# Patient Record
Sex: Male | Born: 1990 | Race: Black or African American | Hispanic: No | Marital: Single | Smoking: Never smoker
Health system: Southern US, Community
[De-identification: ages and names within clinical notes are randomized; demographics above are authoritative.]

## PROBLEM LIST (undated history)

## (undated) DIAGNOSIS — J45909 Unspecified asthma, uncomplicated: Secondary | ICD-10-CM

## (undated) HISTORY — PX: MYRINGOTOMY: SUR874

## (undated) HISTORY — DX: Unspecified asthma, uncomplicated: J45.909

---
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
## 2011-04-30 DIAGNOSIS — L2089 Other atopic dermatitis: Secondary | ICD-10-CM | POA: Insufficient documentation

## 2011-04-30 DIAGNOSIS — L709 Acne, unspecified: Secondary | ICD-10-CM | POA: Insufficient documentation

## 2012-08-11 DIAGNOSIS — L819 Disorder of pigmentation, unspecified: Secondary | ICD-10-CM | POA: Insufficient documentation

## 2012-12-15 DIAGNOSIS — IMO0002 Reserved for concepts with insufficient information to code with codable children: Secondary | ICD-10-CM | POA: Insufficient documentation

## 2013-04-13 ENCOUNTER — Ambulatory Visit: Payer: Self-pay | Admitting: Family Medicine

## 2013-07-20 DIAGNOSIS — L299 Pruritus, unspecified: Secondary | ICD-10-CM | POA: Insufficient documentation

## 2014-04-14 HISTORY — PX: REFRACTIVE SURGERY: SHX103

## 2015-05-16 ENCOUNTER — Telehealth: Payer: Self-pay | Admitting: Family Medicine

## 2015-05-16 NOTE — Telephone Encounter (Signed)
Spoke with patient, i do not see an actual note or evidence when he had the break out of chicken pox and in [facility] it just says immunity from having this disease, will request chart from storage.-aa

## 2015-05-16 NOTE — Telephone Encounter (Signed)
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  He needs to know when he had chicken pox. And also needs a tb test.  He said he was a patient of Dr. Wonda Olds when he had the chicken pox.  It was around 1996 or 97.  He is going to find out from the employer if he needs the blood test or for TB.   His call back is  [phone]  Thanks Barth Kirks

## 2015-05-16 NOTE — Telephone Encounter (Signed)
Pt advised that office note was found from 2001 and copy of that left up front for patient to pick up. Patient was advised if he needs anything else for this from [place] to let us know.-aa

## 2015-05-21 ENCOUNTER — Encounter: Payer: Self-pay | Admitting: Family Medicine

## 2015-05-21 ENCOUNTER — Ambulatory Visit (INDEPENDENT_AMBULATORY_CARE_PROVIDER_SITE_OTHER): Payer: BLUE CROSS/BLUE SHIELD | Admitting: Family Medicine

## 2015-05-21 VITALS — BP 116/76 | HR 82 | Temp 97.8°F | Resp 16 | Ht 70.0 in | Wt 246.2 lb

## 2015-05-21 DIAGNOSIS — Z0184 Encounter for antibody response examination: Secondary | ICD-10-CM

## 2015-05-21 NOTE — Patient Instructions (Signed)
We will call you with the lab result. 

## 2015-05-21 NOTE — Progress Notes (Signed)
Subjective:     Patient ID: Rodney Ali, male   DOB: April 07, 1991, 25 y.o.   MRN: [id number]  HPI  Chief Complaint  Patient presents with  . Labs Only    Patient comes in office today requesting a lab slip for varicella titer. Patient reports that he had virus as a child, but needs titer done for his job.      Review of Systems  Respiratory:       Reports mild EIA if he runs in cold air.       Objective:   Physical Exam  Constitutional: He appears well-developed and well-nourished. No distress.       Assessment:    1. Immunity status testing - Varicella zoster antibody, IgG    Plan:    Will provide copy of test results for patient's job.

## 2015-05-22 ENCOUNTER — Telehealth: Payer: Self-pay

## 2015-05-22 LAB — VARICELLA ZOSTER ANTIBODY, IGG: VARICELLA: 1596 {index} (ref >165)

## 2015-05-22 NOTE — Telephone Encounter (Signed)
-----   Message from Anola Gurney, [place] sent at 05/22/2015  8:10 AM EST ----- Antibody titer c/w prior chicken pox. Please provide copy for the patient.

## 2015-05-22 NOTE — Telephone Encounter (Signed)
Patient has been advised, copy of report is at front desk for pick up. KW

## 2015-05-26 IMAGING — CR DG CHEST 2V
1 series · 3 of 3 positions shown · non-contrast
Comparison: None.

CLINICAL DATA: Pain.  Abnormal EKG.

EXAM:
CHEST  2 VIEW

[Series 1: pa · 0.17mm/px · 3 of 3 slices shown]
[im 1/3]
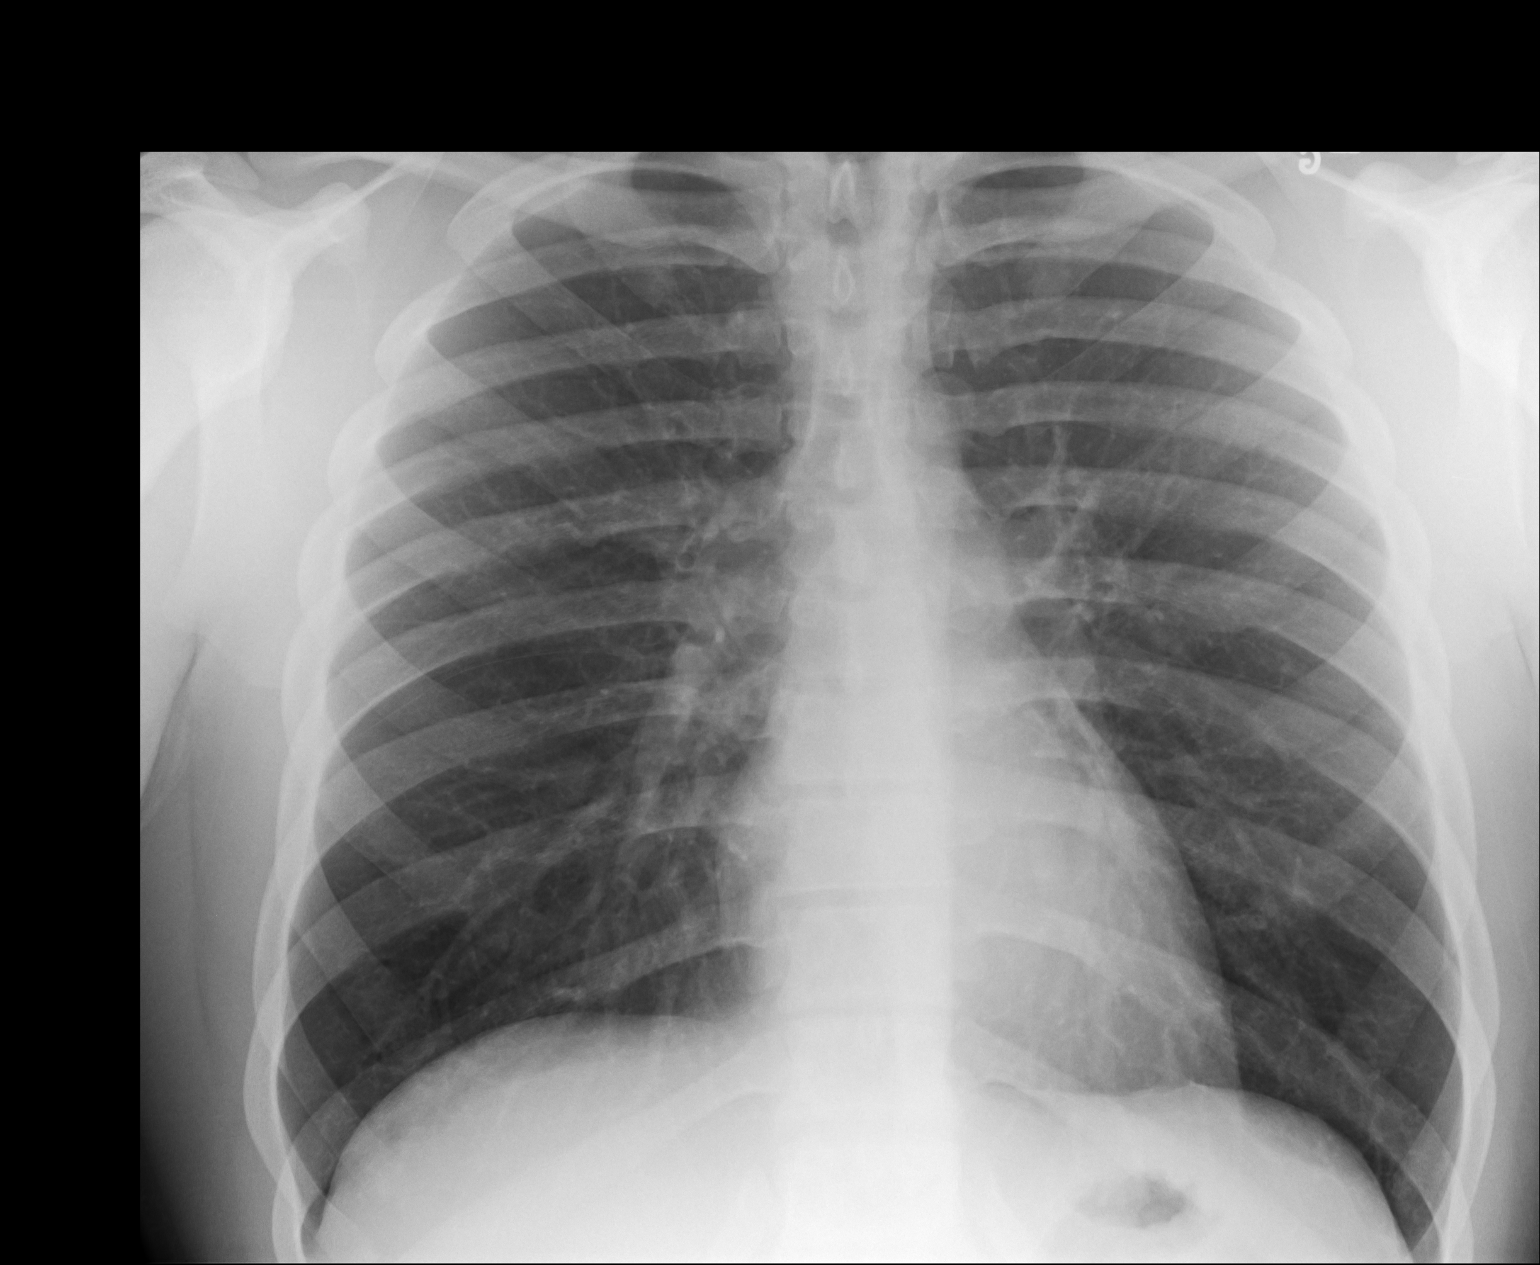
[im 2/3]
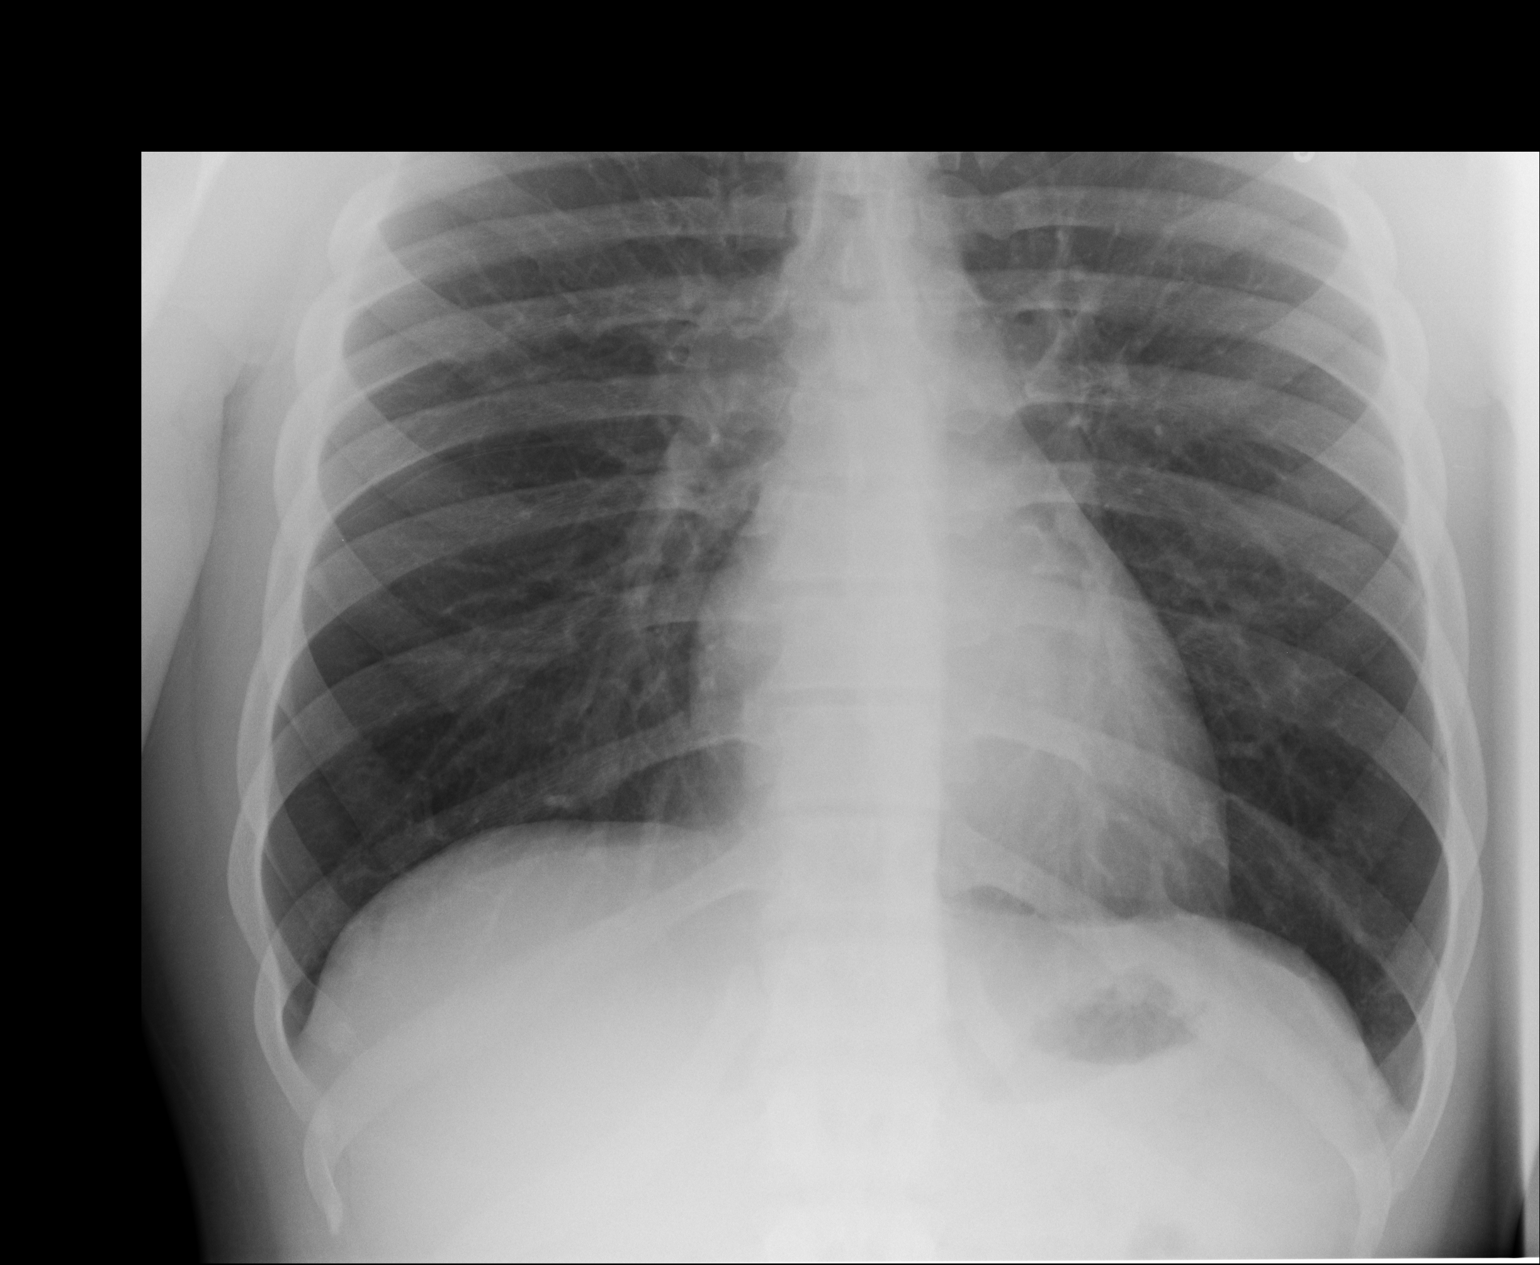
[im 3/3]
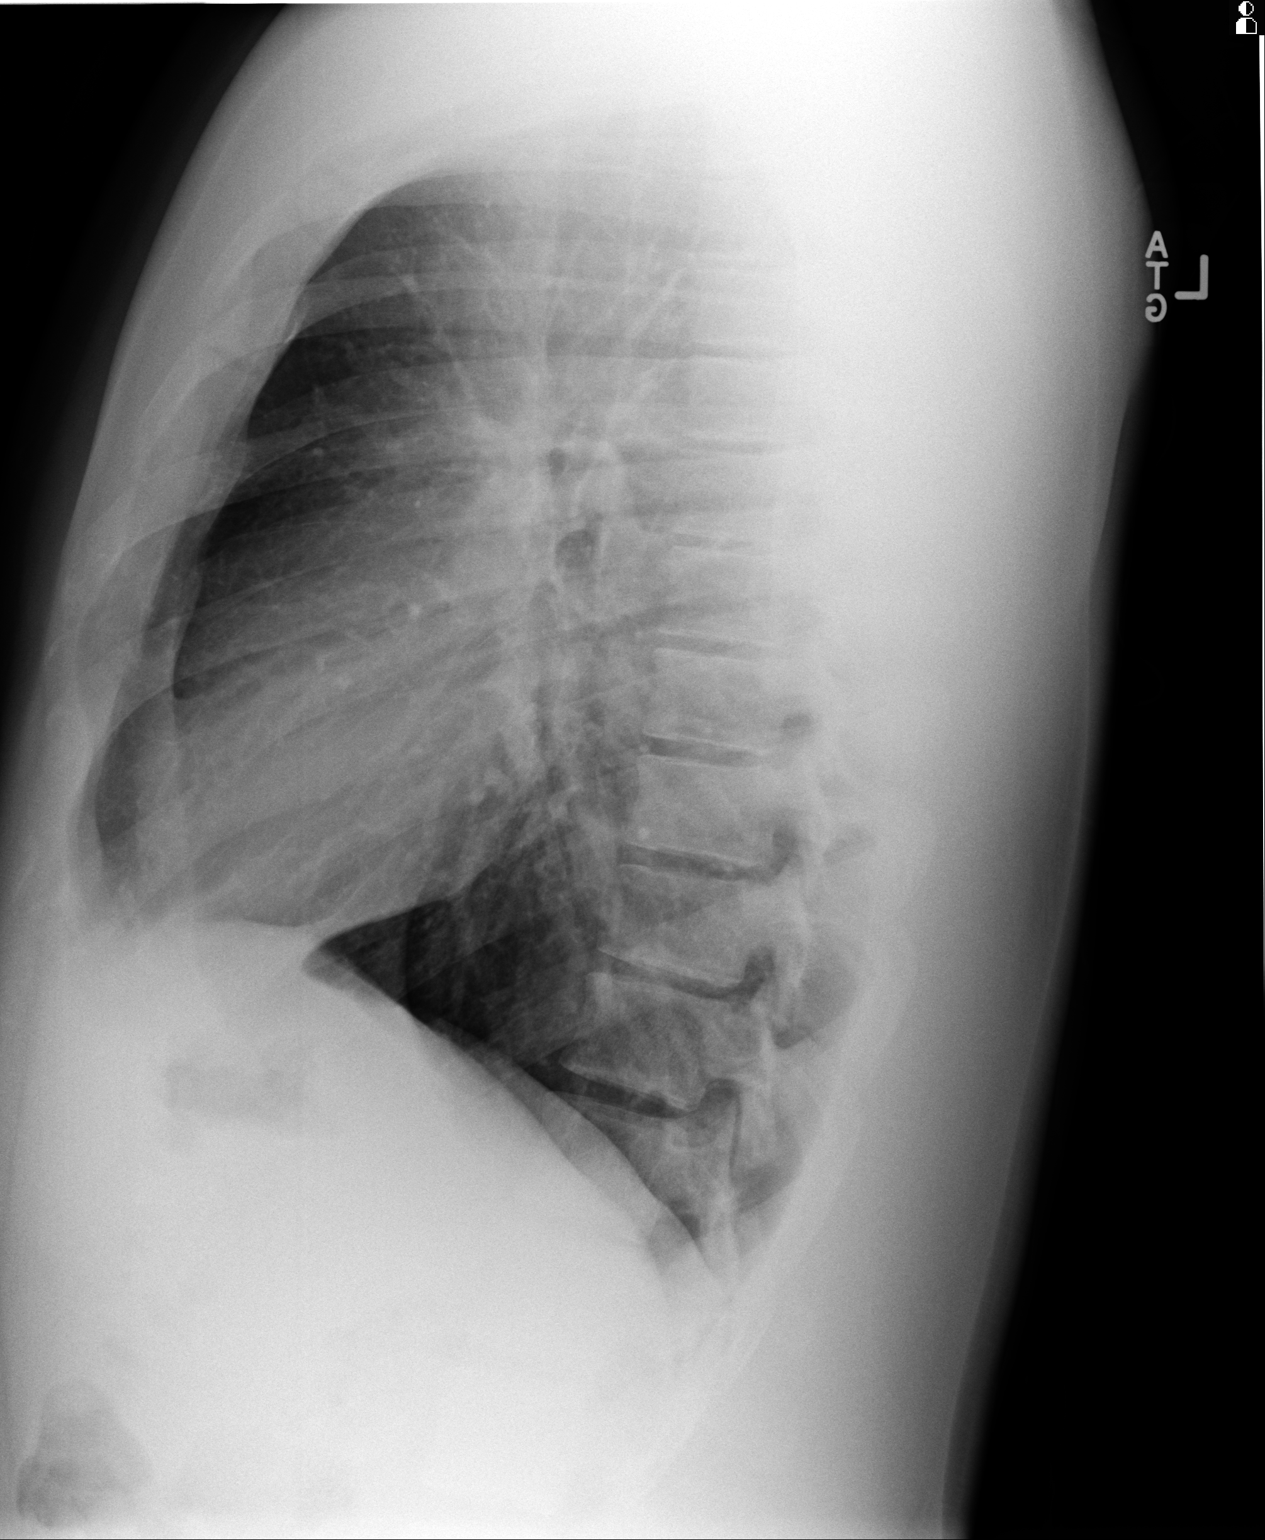

[3 of 3 positions shown; findings below may reference images not displayed]

FINDINGS: The heart size and mediastinal contours are within normal limits.
Both lungs are clear. The visualized skeletal structures are
unremarkable.
IMPRESSION: No active cardiopulmonary disease.

## 2016-05-28 ENCOUNTER — Ambulatory Visit (INDEPENDENT_AMBULATORY_CARE_PROVIDER_SITE_OTHER): Payer: BLUE CROSS/BLUE SHIELD | Admitting: Family Medicine

## 2016-05-28 ENCOUNTER — Encounter: Payer: Self-pay | Admitting: Family Medicine

## 2016-05-28 VITALS — BP 132/84 | HR 68 | Temp 97.8°F | Resp 16 | Ht 69.0 in | Wt 252.0 lb

## 2016-05-28 DIAGNOSIS — Z0000 Encounter for general adult medical examination without abnormal findings: Secondary | ICD-10-CM | POA: Diagnosis not present

## 2016-05-28 DIAGNOSIS — Z23 Encounter for immunization: Secondary | ICD-10-CM

## 2016-05-28 DIAGNOSIS — K6289 Other specified diseases of anus and rectum: Secondary | ICD-10-CM

## 2016-05-28 LAB — POCT URINALYSIS DIPSTICK
Bilirubin, UA: NEGATIVE
Glucose, UA: NEGATIVE
KETONES UA: NEGATIVE
LEUKOCYTES UA: NEGATIVE
Nitrite, UA: NEGATIVE
PH UA: 7.5
PROTEIN UA: NEGATIVE
RBC UA: NEGATIVE
SPEC GRAV UA: 1.015
Urobilinogen, UA: 0.2

## 2016-05-28 NOTE — Progress Notes (Signed)
Patient: Rodney Ali, Male    DOB: [birth year], 26 y.o.   MRN: [id number] Visit Date: 05/28/2016  Today's Provider: Megan Mansichard Charisse Wendell Jr, MD   Chief Complaint  Patient presents with  . Annual Exam  . Rectal Pain   Subjective:    Annual physical exam Rodney Ali is a 26 y.o. male who presents today for health maintenance and complete physical. He feels well. He reports exercising for about 80 minutes 4 days a week. Is boxing and play golf. He reports he is sleeping well. Pt is c/o rectal pain. Pt has noticed the pain while squatting or working on core exercises. Pt denies bloody stool, abdominal pain, constipation/dirrahea. No known anal trauma. He is single and presently lives in [place]. He is hoping to get a new job in [place]. -----------------------------------------------------------------   Review of Systems  Constitutional: Negative.   HENT: Negative.   Eyes: Negative.   Respiratory: Negative.   Cardiovascular: Negative.   Gastrointestinal: Positive for rectal pain. Negative for abdominal distention, abdominal pain, anal bleeding, blood in stool, constipation, diarrhea, nausea and vomiting.  Endocrine: Negative.   Genitourinary: Negative.   Musculoskeletal: Negative.   Skin: Negative.   Allergic/Immunologic: Negative.   Neurological: Negative.   Hematological: Negative.   Psychiatric/Behavioral: Negative.     Social History      He  reports that he has never smoked. He quit smokeless tobacco use about 2 years ago. He reports that he drinks alcohol. He reports that he does not use drugs.       Social History   Social History  . Marital status: Single    Spouse name: N/A  . Number of children: 0  . Years of education: bachelors   Social History Main Topics  . Smoking status: Never Smoker  . Smokeless tobacco: Former NeurosurgeonUser    Quit date: 04/13/2014  . Alcohol use 0.0 oz/week     Comment: couple times a week  . Drug use: No  . Sexual  activity: Yes   Other Topics Concern  . None   Social History Narrative  . None    Past Medical History:  Diagnosis Date  . Asthma, mild      Patient Active Problem List   Diagnosis Date Noted  . Acne 04/30/2011    Past Surgical History:  Procedure Laterality Date  . MYRINGOTOMY    . REFRACTIVE SURGERY Bilateral 2016    Family History        Family Status  Relation Status  . Mother Alive  . Father Alive  . Brother Alive        His family history includes Asthma in his brother; Healthy in his mother; Hypertension in his father.     Allergies  Allergen Reactions  . Fruit & Vegetable Daily [Nutritional Supplements]     Patient is only allergic to some fruits and raw vegetables.      Current Outpatient Prescriptions:  .  diphenhydrAMINE (BENADRYL) 25 mg capsule, Take by mouth as needed., Disp: , Rfl:  .  tretinoin (RETIN-A) 0.025 % cream, , Disp: , Rfl: 0 .  lisdexamfetamine (VYVANSE) 50 MG capsule, as needed., Disp: , Rfl:    Patient Care Team: Maple Hudsonichard L Yoshimi Sarr Jr., MD as PCP - General (Family Medicine)      Objective:   Vitals: BP 132/84 (BP Location: Left Arm, Patient Position: Sitting, Cuff Size: Large)   Pulse 68   Temp 97.8 F (36.6 C) (Oral)  Resp 16   Ht 5\' 9"  (1.753 m)   Wt 252 lb (114.3 kg)   BMI 37.21 kg/m    Physical Exam  Constitutional: He is oriented to person, place, and time. He appears well-developed and well-nourished. No distress.  HENT:  Head: Normocephalic and atraumatic.  Right Ear: Tympanic membrane and external ear normal.  Left Ear: Tympanic membrane and external ear normal.  Nose: Nose normal.  Mouth/Throat: Oropharynx is clear and moist. No oropharyngeal exudate.  Eyes: Conjunctivae are normal. Pupils are equal, round, and reactive to light. Right eye exhibits no discharge. Left eye exhibits no discharge.  Neck: Normal range of motion. Neck supple. No tracheal deviation present. No thyromegaly present.    Cardiovascular: Normal rate, regular rhythm and normal heart sounds.   Pulmonary/Chest: Effort normal and breath sounds normal. No respiratory distress.  Abdominal: Soft. Bowel sounds are normal. There is no tenderness.  Genitourinary: Rectum normal, prostate normal and penis normal. No penile tenderness.  Genitourinary Comments: Left perianal tenderness, no fullness but there is little tenderness. No obvious hemorrhoid. No DRE is performed because of the tenderness and young age.  Musculoskeletal: Normal range of motion. He exhibits no edema.  Lymphadenopathy:    He has no cervical adenopathy.  Neurological: He is alert and oriented to person, place, and time. He has normal reflexes.  Skin: Skin is warm and dry. He is not diaphoretic.  Psychiatric: He has a normal mood and affect. His behavior is normal. Judgment and thought content normal.     Depression Screen PHQ 2/9 Scores 05/28/2016  PHQ - 2 Score 0      Assessment & Plan:     Routine Health Maintenance and Physical Exam  Exercise Activities and Dietary recommendations Goals    None       There is no immunization history on file for this patient.  Health Maintenance  Topic Date Due  . HIV Screening  03/13/2007  . TETANUS/TDAP  03/13/2011  . INFLUENZA VACCINE  11/13/2015     Discussed health benefits of physical activity, and encouraged him to engage in regular exercise appropriate for his age and condition.    -------------------------------------------------------------------- 1. Annual physical exam Stable. Will check labs. FU pending results. Pt possible moving to [place]. Discussed keeping weight down as he ages. - CBC with Differential/Platelet - Comprehensive metabolic panel - Lipid panel - TSH - POCT urinalysis dipstick  2. Perianal pain Hot baths for now. If this persists will refer to surgery as this could be a very early perianal abscess.  3. Need for hepatitis A vaccination Administered  today. - Hepatitis A vaccine adult IM  4. Need for HPV vaccination First vaccine administered today. Pt given updated [facility] report [facility] so pt can complete series. - HPV 9-valent vaccine,Recombinat   Patient seen and examined by Julieanne Manson, MD, and note scribed by Allene Dillon, CMA.  Kyleigha Markert Wendelyn Breslow, MD  [facility]

## 2016-05-30 LAB — CBC WITH DIFFERENTIAL/PLATELET
Basophils Absolute: 0.1 10*3/uL (ref 0.0–0.2)
Basos: 2 %
EOS (ABSOLUTE): 0.3 10*3/uL (ref 0.0–0.4)
EOS: 5 %
HEMATOCRIT: 47.1 % (ref 37.5–51.0)
HEMOGLOBIN: 16 g/dL (ref 13.0–17.7)
Immature Grans (Abs): 0 10*3/uL (ref 0.0–0.1)
Immature Granulocytes: 0 %
LYMPHS ABS: 1.8 10*3/uL (ref 0.7–3.1)
Lymphs: 34 %
MCH: 31.1 pg (ref 26.6–33.0)
MCHC: 34 g/dL (ref 31.5–35.7)
MCV: 92 fL (ref 79–97)
MONOCYTES: 14 %
Monocytes Absolute: 0.7 10*3/uL (ref 0.1–0.9)
NEUTROS ABS: 2.3 10*3/uL (ref 1.4–7.0)
Neutrophils: 45 %
Platelets: 257 10*3/uL (ref 150–379)
RBC: 5.15 x10E6/uL (ref 4.14–5.80)
RDW: 13.2 % (ref 12.3–15.4)
WBC: 5.1 10*3/uL (ref 3.4–10.8)

## 2016-05-30 LAB — COMPREHENSIVE METABOLIC PANEL
ALBUMIN: 4.6 g/dL (ref 3.5–5.5)
ALK PHOS: 59 IU/L (ref 39–117)
ALT: 28 IU/L (ref 0–44)
AST: 25 IU/L (ref 0–40)
Albumin/Globulin Ratio: 1.7 (ref 1.2–2.2)
BUN / CREAT RATIO: 13 (ref 9–20)
BUN: 18 mg/dL (ref 6–20)
Bilirubin Total: 0.5 mg/dL (ref 0.0–1.2)
CO2: 24 mmol/L (ref 18–29)
CREATININE: 1.41 mg/dL — AB (ref 0.76–1.27)
Calcium: 10.2 mg/dL (ref 8.7–10.2)
Chloride: 99 mmol/L (ref 96–106)
GFR, EST AFRICAN AMERICAN: 80 mL/min/{1.73_m2} (ref >59)
GFR, EST NON AFRICAN AMERICAN: 69 mL/min/{1.73_m2} (ref >59)
GLOBULIN, TOTAL: 2.7 g/dL (ref 1.5–4.5)
Glucose: 98 mg/dL (ref 65–99)
Potassium: 4.8 mmol/L (ref 3.5–5.2)
SODIUM: 141 mmol/L (ref 134–144)
Total Protein: 7.3 g/dL (ref 6.0–8.5)

## 2016-05-30 LAB — TSH: TSH: 1.49 u[IU]/mL (ref 0.450–4.500)

## 2016-05-30 LAB — LIPID PANEL
CHOL/HDL RATIO: 4.1 ratio (ref 0.0–5.0)
CHOLESTEROL TOTAL: 148 mg/dL (ref 100–199)
HDL: 36 mg/dL — ABNORMAL LOW (ref >39)
LDL CALC: 85 mg/dL (ref 0–99)
Triglycerides: 137 mg/dL (ref 0–149)
VLDL Cholesterol Cal: 27 mg/dL (ref 5–40)

## 2016-06-02 ENCOUNTER — Telehealth: Payer: Self-pay | Admitting: Family Medicine

## 2016-06-02 DIAGNOSIS — K6289 Other specified diseases of anus and rectum: Secondary | ICD-10-CM

## 2016-06-02 NOTE — Telephone Encounter (Signed)
Pt's father Rodney Ali stated that they would like to go ahead with the referral that was discussed during pt's OV on 05/28/16. Rodney Ali was advised that pt is out of the office today. Please advise. Thanks TNP

## 2016-06-02 NOTE — Telephone Encounter (Signed)
Please review, I think its about the surgery referral but wanted to make sure before putting the order in-aa

## 2016-06-02 NOTE — Telephone Encounter (Signed)
Surgery referral for perianal pain. It could be a very early abscess. He was certainly more sensitive on the left than the right anal verge.

## 2016-06-02 NOTE — Telephone Encounter (Signed)
Orders placed.

## 2016-06-03 ENCOUNTER — Encounter: Payer: Self-pay | Admitting: *Deleted

## 2016-06-17 ENCOUNTER — Ambulatory Visit (INDEPENDENT_AMBULATORY_CARE_PROVIDER_SITE_OTHER): Payer: BLUE CROSS/BLUE SHIELD | Admitting: General Surgery

## 2016-06-17 ENCOUNTER — Encounter: Payer: Self-pay | Admitting: General Surgery

## 2016-06-17 VITALS — BP 126/88 | HR 108 | Resp 12 | Ht 69.0 in | Wt 246.0 lb

## 2016-06-17 DIAGNOSIS — R102 Pelvic and perineal pain: Secondary | ICD-10-CM | POA: Diagnosis not present

## 2016-06-17 NOTE — Progress Notes (Signed)
Patient ID: Rodney Ali, male   DOB: [birth year], 26 y.o.   MRN: [id number]  Chief Complaint  Patient presents with  . Other    HPI Rodney DelChristopher P Sermons is a 26 y.o. male here today for a evaluation of a perianal pain. He noticed some tenderness about a month ago.The discomfort is described as a spasm-like sensation, on the left side more often than the right. This usually lasts only about 20  Seconds. It started at the same time he took up boxing for exercise.   He may have several episodes in one day, and then may go for a week without symptoms. No pain when he moves his bowels, voiding or during intercourse.    He majored in exercise physiology at [employer], now working in Field seismologistmedical sales.    HPI  Past Medical History:  Diagnosis Date  . Asthma, mild     Past Surgical History:  Procedure Laterality Date  . MYRINGOTOMY    . REFRACTIVE SURGERY Bilateral 2016    Family History  Problem Relation Age of Onset  . Healthy Mother   . Hypertension Father   . Asthma Brother     Social History Social History  Substance Use Topics  . Smoking status: Never Smoker  . Smokeless tobacco: Former NeurosurgeonUser    Quit date: 04/13/2014  . Alcohol use 0.0 oz/week     Comment: couple times a week    Allergies  Allergen Reactions  . Fruit & Vegetable Daily [Nutritional Supplements]     Patient is only allergic to some fruits and raw vegetables.     Current Outpatient Prescriptions  Medication Sig Dispense Refill  . diphenhydrAMINE (BENADRYL) 25 mg capsule Take by mouth as needed.    Marland Kitchen. lisdexamfetamine (VYVANSE) 50 MG capsule as needed.    . tretinoin (RETIN-A) 0.025 % cream   0   No current facility-administered medications for this visit.     Review of Systems Review of Systems  Constitutional: Negative.   Respiratory: Negative.   Cardiovascular: Negative.     Blood pressure 126/88, pulse (!) 108, resp. rate 12, height 5\' 9"  (1.753 m), weight 246 lb (111.6  kg).  Physical Exam Physical Exam  Constitutional: He is oriented to person, place, and time. He appears well-developed and well-nourished.  Cardiovascular: Normal rate, regular rhythm and normal heart sounds.   Pulmonary/Chest: Effort normal and breath sounds normal.  Genitourinary: Prostate normal. Rectal exam shows external hemorrhoid. Rectal exam shows no fissure, no mass and anal tone normal.     Lymphadenopathy:       Right: No inguinal adenopathy present.       Left: No inguinal adenopathy present.  Neurological: He is alert and oriented to person, place, and time.  Skin: Skin is warm and dry.      Assessment    Perineal spasm, unclear etiology.     Plan    Observation at present. Patient to report if increased frequency or if he detects any triggering events.      This information has been scribed by Ples SpecterJessica Qualls CMA.   Earline MayotteByrnett, [place] Latka W 06/18/2016, 9:58 AM

## 2016-06-18 DIAGNOSIS — R102 Pelvic and perineal pain: Secondary | ICD-10-CM | POA: Insufficient documentation

## 2018-01-18 ENCOUNTER — Encounter: Payer: BLUE CROSS/BLUE SHIELD | Admitting: Family Medicine

## 2018-01-25 ENCOUNTER — Other Ambulatory Visit: Payer: Self-pay

## 2018-01-25 ENCOUNTER — Ambulatory Visit (INDEPENDENT_AMBULATORY_CARE_PROVIDER_SITE_OTHER): Payer: BLUE CROSS/BLUE SHIELD | Admitting: Family Medicine

## 2018-01-25 VITALS — BP 142/88 | HR 83 | Temp 97.7°F | Resp 16 | Ht 70.0 in | Wt 244.0 lb

## 2018-01-25 DIAGNOSIS — Z0000 Encounter for general adult medical examination without abnormal findings: Secondary | ICD-10-CM | POA: Diagnosis not present

## 2018-01-25 NOTE — Progress Notes (Signed)
Patient: Rodney Ali, Male    DOB: 06/18/1990, 27 y.o.   MRN: [id number] Visit Date: 01/25/2018  Today's Provider: Wilhemena Durie, MD   Chief Complaint  Patient presents with  . Annual Exam   Subjective:  Rodney Ali is a 27 y.o. male who presents today for health maintenance and complete physical. He feels well. He reports exercising 5 times weekly. He reports he is sleeping well. He is single and lives in [place]. His 67 yo Mother has recently been diagnosed with Stage 4 pancreatic cancer. Overall he feels well and is coping ok.  Review of Systems  Constitutional: Negative.   HENT: Negative.   Eyes: Negative.   Respiratory: Negative.   Cardiovascular: Negative.   Gastrointestinal: Negative.   Endocrine: Negative.   Genitourinary: Negative.   Musculoskeletal: Negative.   Skin: Negative.   Allergic/Immunologic: Negative.   Neurological: Negative.   Hematological: Negative.   Psychiatric/Behavioral: Negative.     Social History   Socioeconomic History  . Marital status: Single    Spouse name: Not on file  . Number of children: 0  . Years of education: bachelors  . Highest education level: Not on file  Occupational History  . Not on file  Social Needs  . Financial resource strain: Not on file  . Food insecurity:    Worry: Not on file    Inability: Not on file  . Transportation needs:    Medical: Not on file    Non-medical: Not on file  Tobacco Use  . Smoking status: Never Smoker  . Smokeless tobacco: Former Network engineer and Sexual Activity  . Alcohol use: Yes    Alcohol/week: 0.0 standard drinks    Comment: couple times a week  . Drug use: No  . Sexual activity: Yes  Lifestyle  . Physical activity:    Days per week: Not on file    Minutes per session: Not on file  . Stress: Not on file  Relationships  . Social connections:    Talks on phone: Not on file    Gets together: Not on file    Attends religious service: Not on file   Active member of club or organization: Not on file    Attends meetings of clubs or organizations: Not on file    Relationship status: Not on file  . Intimate partner violence:    Fear of current or ex partner: Not on file    Emotionally abused: Not on file    Physically abused: Not on file    Forced sexual activity: Not on file  Other Topics Concern  . Not on file  Social History Narrative  . Not on file    Patient Active Problem List   Diagnosis Date Noted  . Perineal pain in male 06/18/2016    Past Surgical History:  Procedure Laterality Date  . MYRINGOTOMY    . REFRACTIVE SURGERY Bilateral 2016    His family history includes Asthma in his brother; Healthy in his mother; Hypertension in his father.     Outpatient Encounter Medications as of 01/25/2018  Medication Sig Note  . diphenhydrAMINE (BENADRYL) 25 mg capsule Take by mouth as needed. 05/21/2015: Received from: [facility] Received Sig: Take by mouth.  . [DISCONTINUED] lisdexamfetamine (VYVANSE) 50 MG capsule as needed. 05/21/2015: Received from: [facility] Received Sig: TAKE ONE CAPSULE BY MOUTH DAILY  . [DISCONTINUED] tretinoin (RETIN-A) 0.025 % cream  05/21/2015: Received from: External Pharmacy  No facility-administered encounter medications on file as of 01/25/2018.     Patient Care Team: Jerrol Banana., MD as PCP - General (Family Medicine) Jerrol Banana., MD (Family Medicine) Bary Castilla Forest Gleason, MD (General Surgery)      Objective:   Vitals:  Vitals:   01/25/18 0818  BP: (!) 142/88  Pulse: 83  Resp: 16  Temp: 97.7 F (36.5 C)  TempSrc: Oral  SpO2: 98%  Weight: 244 lb (110.7 kg)  Height: 5' 10"  (1.778 m)    Physical Exam  Constitutional: He is oriented to person, place, and time. He appears well-developed and well-nourished.  Muscular male in NAD.Waist size 35-36.  HENT:  Head: Normocephalic and atraumatic.  Right Ear: External ear  normal.  Left Ear: External ear normal.  Nose: Nose normal.  Mouth/Throat: Oropharynx is clear and moist.  Eyes: Pupils are equal, round, and reactive to light. Conjunctivae and EOM are normal.  Neck: Normal range of motion. Neck supple.  Cardiovascular: Normal rate, regular rhythm and normal heart sounds.  Pulmonary/Chest: Effort normal and breath sounds normal.  Abdominal: Soft. Bowel sounds are normal.  Genitourinary: Penis normal.  Musculoskeletal: Normal range of motion.  Neurological: He is alert and oriented to person, place, and time.  Skin: Skin is warm and dry.  Psychiatric: He has a normal mood and affect. His behavior is normal. Judgment and thought content normal.  Recheck BP is 120/74.   Depression Screen PHQ 2/9 Scores 01/25/2018 05/28/2016  PHQ - 2 Score 0 0      Assessment & Plan:     Routine Health Maintenance and Physical Exam  Exercise Activities and Dietary recommendations Goals   None     Immunization History  Administered Date(s) Administered  . DTaP 05/10/1992, 08/02/1992, 10/02/1992, 10/04/1993, 05/25/1997  . HPV 9-valent 05/28/2016  . Hepatitis A 04/09/2010  . Hepatitis A, Adult 05/28/2016  . Hepatitis B 03/16/1992, 04/20/1992, 10/02/1992  . HiB (PRP-OMP) 05/10/1992, 08/02/1992, 11/14/1992, 10/04/1993  . IPV 05/10/1992, 08/02/1992, 10/02/1992, 05/25/1997  . Influenza Split 12/14/2015  . Influenza, Seasonal, Injecte, Preservative Fre 02/03/2017  . MMR 10/04/1993, 05/25/1997  . Meningococcal Conjugate 04/09/2010  . Tdap 04/09/2010    Health Maintenance  Topic Date Due  . HIV Screening  03/13/2007  . INFLUENZA VACCINE  11/12/2017  . TETANUS/TDAP  04/09/2020     Discussed health benefits of physical activity, and encouraged him to engage in regular exercise appropriate for his age and condition. Discussed goal waist of 34 inches for him.   I have done the exam and reviewed the chart and it is accurate to the best of my knowledge.  Development worker, community has been used and  any errors in dictation or transcription are unintentional. Miguel Aschoff M.D. [facility]

## 2018-01-26 LAB — LIPID PANEL WITH LDL/HDL RATIO
Cholesterol, Total: 133 mg/dL (ref 100–199)
HDL: 37 mg/dL — AB (ref >39)
LDL Calculated: 36 mg/dL (ref 0–99)
LDL/HDL RATIO: 1 ratio (ref 0.0–3.6)
TRIGLYCERIDES: 298 mg/dL — AB (ref 0–149)
VLDL CHOLESTEROL CAL: 60 mg/dL — AB (ref 5–40)

## 2018-01-26 LAB — COMPREHENSIVE METABOLIC PANEL
ALT: 18 IU/L (ref 0–44)
AST: 17 IU/L (ref 0–40)
Albumin/Globulin Ratio: 2 (ref 1.2–2.2)
Albumin: 4.7 g/dL (ref 3.5–5.5)
Alkaline Phosphatase: 57 IU/L (ref 39–117)
BUN/Creatinine Ratio: 12 (ref 9–20)
BUN: 20 mg/dL (ref 6–20)
Bilirubin Total: 0.5 mg/dL (ref 0.0–1.2)
CALCIUM: 10.2 mg/dL (ref 8.7–10.2)
CO2: 23 mmol/L (ref 20–29)
CREATININE: 1.66 mg/dL — AB (ref 0.76–1.27)
Chloride: 102 mmol/L (ref 96–106)
GFR calc Af Amer: 65 mL/min/{1.73_m2} (ref >59)
GFR, EST NON AFRICAN AMERICAN: 56 mL/min/{1.73_m2} — AB (ref >59)
GLUCOSE: 86 mg/dL (ref 65–99)
Globulin, Total: 2.3 g/dL (ref 1.5–4.5)
Potassium: 4.7 mmol/L (ref 3.5–5.2)
Sodium: 143 mmol/L (ref 134–144)
Total Protein: 7 g/dL (ref 6.0–8.5)

## 2018-01-26 LAB — CBC WITH DIFFERENTIAL/PLATELET
BASOS ABS: 0.1 10*3/uL (ref 0.0–0.2)
Basos: 2 %
EOS (ABSOLUTE): 0.3 10*3/uL (ref 0.0–0.4)
Eos: 7 %
Hematocrit: 49.5 % (ref 37.5–51.0)
Hemoglobin: 16.5 g/dL (ref 13.0–17.7)
IMMATURE GRANS (ABS): 0 10*3/uL (ref 0.0–0.1)
Immature Granulocytes: 0 %
LYMPHS: 41 %
Lymphocytes Absolute: 1.7 10*3/uL (ref 0.7–3.1)
MCH: 29.8 pg (ref 26.6–33.0)
MCHC: 33.3 g/dL (ref 31.5–35.7)
MCV: 90 fL (ref 79–97)
MONOS ABS: 0.6 10*3/uL (ref 0.1–0.9)
Monocytes: 14 %
NEUTROS PCT: 36 %
Neutrophils Absolute: 1.5 10*3/uL (ref 1.4–7.0)
PLATELETS: 272 10*3/uL (ref 150–450)
RBC: 5.53 x10E6/uL (ref 4.14–5.80)
RDW: 12.4 % (ref 12.3–15.4)
WBC: 4.2 10*3/uL (ref 3.4–10.8)

## 2018-01-26 LAB — TSH: TSH: 1.35 u[IU]/mL (ref 0.450–4.500)

## 2018-09-29 ENCOUNTER — Telehealth: Payer: Self-pay

## 2018-09-29 NOTE — Telephone Encounter (Signed)
Ok to order    thanks

## 2018-09-29 NOTE — Telephone Encounter (Signed)
Patient has been exposed to covid positive friends.  Would like to see about getting tested for Covid.  Patient is in [place].   CB# [phone]

## 2018-09-29 NOTE — Telephone Encounter (Signed)
If he cannot find a spot in [place] he might just hae to drive to [place] for testing.

## 2018-09-29 NOTE — Telephone Encounter (Signed)
FYI-Dr Rosanna Randy I have no idea about ordering Covid in [place].  I gave him some numbers to call to see about getting test done.

## 2021-07-16 NOTE — Progress Notes (Signed)
? ? ? ?Complete physical exam ? ?I,Rodney Ali,acting as a scribe for Wilhemena Durie, MD.,have documented all relevant documentation on the behalf of Wilhemena Durie, MD,as directed by  Wilhemena Durie, MD while in the presence of Wilhemena Durie, MD. ? ? ?Patient: Rodney Ali   DOB: 29-Jan-1991   31 y.o. Male  MRN: [id number] ?Visit Date: 07/22/2021 ? ?Today's healthcare provider: Wilhemena Durie, MD  ? ?Chief Complaint  ?Patient presents with  ? Annual Exam  ? ?Subjective  ?  ?Rodney Ali is a 31 y.o. male who presents today for a complete physical exam.  ?He reports consuming a general and fasting  diet. Home exercise routine includes walking, biking. He generally feels well. He reports sleeping well. He does not have additional problems to discuss today.  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  He is considering becoming engaged in the near future his soon-to-be fianc? has cochlear implants. ?Patient does snore and feels sleep might not be totally restorative. ?He complains of a growth on the bottom of his left second toe.  It is not painful and only bothers him at certain times.  He lives in [place].  He plays golf but does not get any regular exercise at this time. ?HPI  ?    ? ?Past Medical History:  ?Diagnosis Date  ? Asthma, mild   ? ?Past Surgical History:  ?Procedure Laterality Date  ? MYRINGOTOMY    ? REFRACTIVE SURGERY Bilateral 2016  ? ?Social History  ? ?Socioeconomic History  ? Marital status: Single  ?  Spouse name: Not on file  ? Number of children: 0  ? Years of education: bachelors  ? Highest education level: Not on file  ?Occupational History  ? Not on file  ?Tobacco Use  ? Smoking status: Never  ? Smokeless tobacco: Former  ?  Quit date: 04/13/2014  ?Substance and Sexual Activity  ? Alcohol use: Yes  ?  Alcohol/week: 0.0 standard drinks  ?  Comment: couple times a week  ? Drug use: No  ? Sexual activity: Yes  ?Other Topics Concern  ? Not on file   ?Social History Narrative  ? Not on file  ? ?Social Determinants of Health  ? ?Financial Resource Strain: Not on file  ?Food Insecurity: Not on file  ?Transportation Needs: Not on file  ?Physical Activity: Not on file  ?Stress: Not on file  ?Social Connections: Not on file  ?Intimate Partner Violence: Not on file  ? ?Family Status  ?Relation Name Status  ? Mother  Alive  ? Father  Alive  ? Brother  Alive  ? ?Family History  ?Problem Relation Age of Onset  ? Healthy Mother   ? Hypertension Father   ? Asthma Brother   ? ?Allergies  ?Allergen Reactions  ? Fruit & Vegetable Daily [Nutritional Supplements]   ?  Patient is only allergic to some fruits and raw vegetables.   ?  ?Patient Care Team: ?Jerrol Banana., MD as PCP - General (Family Medicine) ?Jerrol Banana., MD (Family Medicine) ?Robert Bellow, MD (General Surgery)  ? ?Medications: ?Outpatient Medications Prior to Visit  ?Medication Sig  ? Cetirizine HCl (ZYRTEC PO) Take by mouth.  ? MILK THISTLE PLUS PO Take by mouth.  ? Multiple Vitamin (MULTIVITAMIN PO) Take by mouth.  ? [DISCONTINUED] diphenhydrAMINE (BENADRYL) 25 mg capsule Take by mouth as needed.  ? ?No facility-administered medications prior to visit.  ? ? ?  Review of Systems  ?Allergic/Immunologic: Positive for environmental allergies.  ?Psychiatric/Behavioral:  Positive for agitation.   ?All other systems reviewed and are negative. ? ?Last lipids ?Lab Results  ?Component Value Date  ? CHOL 158 07/22/2021  ? HDL 34 (L) 07/22/2021  ? [place] 90 07/22/2021  ? TRIG 200 (H) 07/22/2021  ? CHOLHDL 4.6 07/22/2021  ? ?  ? Objective  ?  ?BP 123/85 (BP Location: Right Arm, Patient Position: Sitting, Cuff Size: Large)   Pulse 61   Temp 98.4 ?F (36.9 ?C) (Temporal)   Resp 16   Ht 5\' 10"  (1.778 m)   Wt 239 lb (108.4 kg)   SpO2 (!) 14%   PF 98 L/min   BMI 34.29 kg/m?  ?BP Readings from Last 3 Encounters:  ?07/22/21 123/85  ?01/25/18 (!) 142/88  ?06/17/16 126/88  ? ?Wt Readings from Last 3  Encounters:  ?07/22/21 239 lb (108.4 kg)  ?01/25/18 244 lb (110.7 kg)  ?06/17/16 246 lb (111.6 kg)  ? ?  ? ? ?Physical Exam ?Vitals reviewed.  ?Constitutional:   ?   Appearance: He is well-developed.  ?   Comments: Muscular male in NAD.Waist size 35-36.  ?HENT:  ?   Head: Normocephalic and atraumatic.  ?   Right Ear: External ear normal.  ?   Left Ear: External ear normal.  ?   Nose: Nose normal.  ?Eyes:  ?   Conjunctiva/sclera: Conjunctivae normal.  ?   Pupils: Pupils are equal, round, and reactive to light.  ?Cardiovascular:  ?   Rate and Rhythm: Normal rate and regular rhythm.  ?   Heart sounds: Normal heart sounds.  ?Pulmonary:  ?   Effort: Pulmonary effort is normal.  ?   Breath sounds: Normal breath sounds.  ?Abdominal:  ?   General: Bowel sounds are normal.  ?   Palpations: Abdomen is soft.  ?Genitourinary: ?   Penis: Normal.   ?   Testes: Normal.  ?Musculoskeletal:     ?   General: Normal range of motion.  ?   Cervical back: Normal range of motion and neck supple.  ?Skin: ?   General: Skin is warm and dry.  ?   Comments: He has what appears to be a callus on the bottom of his second toe.  No signs of any infection.  ?Neurological:  ?   General: No focal deficit present.  ?   Mental Status: He is alert and oriented to person, place, and time.  ?Psychiatric:     ?   Mood and Affect: Mood normal.     ?   Behavior: Behavior normal.     ?   Thought Content: Thought content normal.     ?   Judgment: Judgment normal.  ?  ? ? ? ?  07/22/2021  ? 11:00 AM  ?Results of the Epworth flowsheet  ?Sitting and reading 1  ?Watching TV 1  ?Sitting, inactive in a public place (e.g. a theatre or a meeting) 0  ?As a passenger in a car for an hour without a break 0  ?Lying down to rest in the afternoon when circumstances permit 1  ?Sitting and talking to someone 0  ?Sitting quietly after a lunch without alcohol 1  ?In a car, while stopped for a few minutes in traffic 0  ?Total score 4  ? ? ?Last depression screening scores ? ?   07/22/2021  ? 10:23 AM 01/25/2018  ?  8:12 AM 05/28/2016  ?  9:38  AM  ?PHQ 2/9 Scores  ?PHQ - 2 Score 0 0 0  ?PHQ- 9 Score 0    ? ?Last fall risk screening ? ?  07/22/2021  ? 10:22 AM  ?Fall Risk   ?Falls in the past year? 0  ?Number falls in past yr: 0  ?Injury with Fall? 0  ?Risk for fall due to : No Fall Risks  ?Follow up Falls evaluation completed  ? ?Last Audit-C alcohol use screening ? ?  07/22/2021  ? 10:22 AM  ?Alcohol Use Disorder Test (AUDIT)  ?1. How often do you have a drink containing alcohol? 3  ?2. How many drinks containing alcohol do you have on a typical day when you are drinking? 1  ?3. How often do you have six or more drinks on one occasion? 2  ?AUDIT-C Score 6  ?4. How often during the last year have you found that you were not able to stop drinking once you had started? 0  ?5. How often during the last year have you failed to do what was normally expected from you because of drinking? 0  ?6. How often during the last year have you needed a first drink in the morning to get yourself going after a heavy drinking session? 0  ?7. How often during the last year have you had a feeling of guilt of remorse after drinking? 0  ?8. How often during the last year have you been unable to remember what happened the night before because you had been drinking? 0  ?9. Have you or someone else been injured as a result of your drinking? 0  ?10. Has a relative or friend or a doctor or another health worker been concerned about your drinking or suggested you cut down? 0  ?Alcohol Use Disorder Identification Test Final Score (AUDIT) 6  ? ?A score of 3 or more in women, and 4 or more in men indicates increased risk for alcohol abuse, EXCEPT if all of the points are from question 1  ? ?No results found for any visits on 07/22/21. ? Assessment & Plan  ?  ?Routine Health Maintenance and Physical Exam ? ?Exercise Activities and Dietary recommendations ? Goals   ?None ?  ? ? ?Immunization History  ?Administered Date(s)  Administered  ? DTaP 05/10/1992, 08/02/1992, 10/02/1992, 10/04/1993, 05/25/1997  ? HPV 9-valent 05/28/2016  ? Hepatitis A 04/09/2010  ? Hepatitis A, Adult 05/28/2016  ? Hepatitis B 03/16/1992, 04/20/1992, 10/02/1992  ? HiB

## 2021-07-22 ENCOUNTER — Ambulatory Visit (INDEPENDENT_AMBULATORY_CARE_PROVIDER_SITE_OTHER): Payer: BC Managed Care – PPO | Admitting: Family Medicine

## 2021-07-22 ENCOUNTER — Encounter: Payer: Self-pay | Admitting: Family Medicine

## 2021-07-22 VITALS — BP 123/85 | HR 61 | Temp 98.4°F | Resp 16 | Ht 70.0 in | Wt 239.0 lb

## 2021-07-22 DIAGNOSIS — Z23 Encounter for immunization: Secondary | ICD-10-CM | POA: Diagnosis not present

## 2021-07-22 DIAGNOSIS — G479 Sleep disorder, unspecified: Secondary | ICD-10-CM

## 2021-07-22 DIAGNOSIS — E782 Mixed hyperlipidemia: Secondary | ICD-10-CM | POA: Diagnosis not present

## 2021-07-22 DIAGNOSIS — Z0000 Encounter for general adult medical examination without abnormal findings: Secondary | ICD-10-CM

## 2021-07-22 DIAGNOSIS — Z1389 Encounter for screening for other disorder: Secondary | ICD-10-CM

## 2021-07-22 LAB — POCT URINALYSIS DIPSTICK
Bilirubin, UA: NEGATIVE
Blood, UA: NEGATIVE
Glucose, UA: NEGATIVE
Ketones, UA: NEGATIVE
Leukocytes, UA: NEGATIVE
Nitrite, UA: NEGATIVE
Protein, UA: NEGATIVE
Spec Grav, UA: 1.01 (ref 1.010–1.025)
Urobilinogen, UA: 0.2 E.U./dL
pH, UA: 6 (ref 5.0–8.0)

## 2021-07-23 LAB — COMPREHENSIVE METABOLIC PANEL
ALT: 23 IU/L (ref 0–44)
AST: 17 IU/L (ref 0–40)
Albumin/Globulin Ratio: 2.2 (ref 1.2–2.2)
Albumin: 4.7 g/dL (ref 4.1–5.2)
Alkaline Phosphatase: 52 IU/L (ref 44–121)
BUN/Creatinine Ratio: 12 (ref 9–20)
BUN: 15 mg/dL (ref 6–20)
Bilirubin Total: 0.5 mg/dL (ref 0.0–1.2)
CO2: 23 mmol/L (ref 20–29)
Calcium: 10.2 mg/dL (ref 8.7–10.2)
Chloride: 104 mmol/L (ref 96–106)
Creatinine, Ser: 1.23 mg/dL (ref 0.76–1.27)
Globulin, Total: 2.1 g/dL (ref 1.5–4.5)
Glucose: 98 mg/dL (ref 70–99)
Potassium: 4.6 mmol/L (ref 3.5–5.2)
Sodium: 141 mmol/L (ref 134–144)
Total Protein: 6.8 g/dL (ref 6.0–8.5)
eGFR: 82 mL/min/{1.73_m2} (ref >59)

## 2021-07-23 LAB — CBC WITH DIFFERENTIAL/PLATELET
Basophils Absolute: 0.1 10*3/uL (ref 0.0–0.2)
Basos: 2 %
EOS (ABSOLUTE): 0.3 10*3/uL (ref 0.0–0.4)
Eos: 6 %
Hematocrit: 48.4 % (ref 37.5–51.0)
Hemoglobin: 15.9 g/dL (ref 13.0–17.7)
Immature Grans (Abs): 0 10*3/uL (ref 0.0–0.1)
Immature Granulocytes: 0 %
Lymphocytes Absolute: 2 10*3/uL (ref 0.7–3.1)
Lymphs: 47 %
MCH: 29.8 pg (ref 26.6–33.0)
MCHC: 32.9 g/dL (ref 31.5–35.7)
MCV: 91 fL (ref 79–97)
Monocytes Absolute: 0.5 10*3/uL (ref 0.1–0.9)
Monocytes: 10 %
Neutrophils Absolute: 1.5 10*3/uL (ref 1.4–7.0)
Neutrophils: 35 %
Platelets: 254 10*3/uL (ref 150–450)
RBC: 5.34 x10E6/uL (ref 4.14–5.80)
RDW: 13 % (ref 11.6–15.4)
WBC: 4.4 10*3/uL (ref 3.4–10.8)

## 2021-07-23 LAB — TSH: TSH: 1.59 u[IU]/mL (ref 0.450–4.500)

## 2021-07-23 LAB — LIPID PANEL
Chol/HDL Ratio: 4.6 ratio (ref 0.0–5.0)
Cholesterol, Total: 158 mg/dL (ref 100–199)
HDL: 34 mg/dL — ABNORMAL LOW (ref >39)
LDL Chol Calc (NIH): 90 mg/dL (ref 0–99)
Triglycerides: 200 mg/dL — ABNORMAL HIGH (ref 0–149)
VLDL Cholesterol Cal: 34 mg/dL (ref 5–40)

## 2021-11-25 ENCOUNTER — Ambulatory Visit
Admission: RE | Admit: 2021-11-25 | Discharge: 2021-11-25 | Disposition: A | Discharge status: Home / Self Care | Payer: BC Managed Care – PPO | Attending: Family Medicine | Admitting: Family Medicine

## 2021-11-25 ENCOUNTER — Encounter: Payer: Self-pay | Admitting: Family Medicine

## 2021-11-25 ENCOUNTER — Ambulatory Visit
Admission: RE | Admit: 2021-11-25 | Discharge: 2021-11-25 | Disposition: A | Discharge status: Home / Self Care | Payer: BC Managed Care – PPO | Source: Ambulatory Visit | Attending: Family Medicine | Admitting: Family Medicine

## 2021-11-25 ENCOUNTER — Ambulatory Visit (INDEPENDENT_AMBULATORY_CARE_PROVIDER_SITE_OTHER): Payer: BC Managed Care – PPO | Admitting: Family Medicine

## 2021-11-25 VITALS — BP 122/78 | HR 68 | Resp 16 | Wt 239.0 lb

## 2021-11-25 DIAGNOSIS — Z23 Encounter for immunization: Secondary | ICD-10-CM | POA: Diagnosis not present

## 2021-11-25 DIAGNOSIS — M79672 Pain in left foot: Secondary | ICD-10-CM

## 2021-11-25 MED ORDER — CELECOXIB 200 MG PO CAPS: 200.0000 mg | ORAL_CAPSULE | Freq: Two times a day (BID) | ORAL | 1 refills | Status: AC

## 2021-11-25 NOTE — Progress Notes (Unsigned)
      Established patient visit  I,Rodney Ali,acting as a scribe for Rodney Mans, MD.,have documented all relevant documentation on the behalf of Rodney Mans, MD,as directed by  Rodney Mans, MD while in the presence of Rodney Mans, MD.   Patient: Rodney Ali   DOB: Oct 26, 1990   30 y.o. Male  MRN: [id number] Visit Date: 11/25/2021  Today's healthcare provider: [name], MD   Chief Complaint  Patient presents with   Foot Pain   Subjective    Foot Pain Pertinent negatives include no abdominal pain, chest pain, chills, fever, nausea or vomiting.    Patient is having left foot pain. Patient states the more he walks the worse the pain is.  No known injury or trauma. Medications: Outpatient Medications Prior to Visit  Medication Sig   Cetirizine HCl (ZYRTEC PO) Take by mouth.   MILK THISTLE PLUS PO Take by mouth.   Multiple Vitamin (MULTIVITAMIN PO) Take by mouth.   No facility-administered medications prior to visit.    Review of Systems  Constitutional:  Negative for appetite change, chills and fever.  Respiratory:  Negative for chest tightness, shortness of breath and wheezing.   Cardiovascular:  Negative for chest pain and palpitations.  Gastrointestinal:  Negative for abdominal pain, nausea and vomiting.        Objective    BP 122/78 (BP Location: Right Arm, Patient Position: Sitting, Cuff Size: Large)   Pulse 68   Resp 16   Wt 239 lb (108.4 kg)   SpO2 97%   BMI 34.29 kg/m  BP Readings from Last 3 Encounters:  11/25/21 122/78  07/22/21 123/85  01/25/18 (!) 142/88   Wt Readings from Last 3 Encounters:  11/25/21 239 lb (108.4 kg)  07/22/21 239 lb (108.4 kg)  01/25/18 244 lb (110.7 kg)      Physical Exam Vitals reviewed.  Musculoskeletal:     Comments: He has mild tenderness along the fifth metatarsal and along the MTP joint  Neurological:     Mental Status: He is alert.       No results found for any  visits on 11/25/21.  Assessment & Plan     1. Left foot pain With nonsteroidal and x-ray to rule out possible stress fracture. - DG Foot Complete Left; Future - Ambulatory referral to Podiatry  2. Need for HPV vaccination  - HPV 9-valent vaccine,Recombinat   No follow-ups on file.      I, Rodney Mans, MD, have reviewed all documentation for this visit. The documentation on 11/26/21 for the exam, diagnosis, procedures, and orders are all accurate and complete.    Altair Appenzeller Wendelyn Breslow, MD  [facility] [phone] (phone) [phone] (fax)  [facility]

## 2022-07-28 ENCOUNTER — Encounter: Payer: BC Managed Care – PPO | Admitting: Family Medicine
